# Patient Record
Sex: Male | Born: 1990 | Race: Black or African American | Hispanic: No | Marital: Single | Smoking: Current every day smoker
Health system: Southern US, Community
[De-identification: ages and names within clinical notes are randomized; demographics above are authoritative.]

## PROBLEM LIST (undated history)

## (undated) DIAGNOSIS — F909 Attention-deficit hyperactivity disorder, unspecified type: Secondary | ICD-10-CM

## (undated) DIAGNOSIS — M419 Scoliosis, unspecified: Secondary | ICD-10-CM

---

## 2010-02-17 ENCOUNTER — Emergency Department (HOSPITAL_COMMUNITY): Admission: EM | Admit: 2010-02-17 | Discharge: 2010-02-17 | Payer: Self-pay | Admitting: Emergency Medicine

## 2010-02-22 ENCOUNTER — Emergency Department (HOSPITAL_COMMUNITY): Admission: EM | Admit: 2010-02-22 | Discharge: 2010-02-22 | Payer: Self-pay | Admitting: Emergency Medicine

## 2016-05-24 ENCOUNTER — Encounter (HOSPITAL_COMMUNITY): Payer: Self-pay | Admitting: Emergency Medicine

## 2016-05-24 DIAGNOSIS — F17200 Nicotine dependence, unspecified, uncomplicated: Secondary | ICD-10-CM | POA: Insufficient documentation

## 2016-05-24 DIAGNOSIS — K529 Noninfective gastroenteritis and colitis, unspecified: Secondary | ICD-10-CM | POA: Insufficient documentation

## 2016-05-24 LAB — COMPREHENSIVE METABOLIC PANEL
ALT: 22 U/L (ref 17–63)
ANION GAP: 7 (ref 5–15)
AST: 31 U/L (ref 15–41)
Albumin: 4 g/dL (ref 3.5–5.0)
Alkaline Phosphatase: 61 U/L (ref 38–126)
BUN: 15 mg/dL (ref 6–20)
CHLORIDE: 108 mmol/L (ref 101–111)
CO2: 24 mmol/L (ref 22–32)
Calcium: 9.2 mg/dL (ref 8.9–10.3)
Creatinine, Ser: 1.05 mg/dL (ref 0.61–1.24)
Glucose, Bld: 115 mg/dL — ABNORMAL HIGH (ref 65–99)
POTASSIUM: 3.7 mmol/L (ref 3.5–5.1)
Sodium: 139 mmol/L (ref 135–145)
Total Bilirubin: 0.5 mg/dL (ref 0.3–1.2)
Total Protein: 6.7 g/dL (ref 6.5–8.1)

## 2016-05-24 LAB — CBC
HEMATOCRIT: 44.5 % (ref 39.0–52.0)
HEMOGLOBIN: 14.7 g/dL (ref 13.0–17.0)
MCH: 27.6 pg (ref 26.0–34.0)
MCHC: 33 g/dL (ref 30.0–36.0)
MCV: 83.6 fL (ref 78.0–100.0)
PLATELETS: 200 10*3/uL (ref 150–400)
RBC: 5.32 MIL/uL (ref 4.22–5.81)
RDW: 12.9 % (ref 11.5–15.5)
WBC: 7 10*3/uL (ref 4.0–10.5)

## 2016-05-24 LAB — URINALYSIS, ROUTINE W REFLEX MICROSCOPIC
Bilirubin Urine: NEGATIVE
GLUCOSE, UA: NEGATIVE mg/dL
Hgb urine dipstick: NEGATIVE
Ketones, ur: NEGATIVE mg/dL
LEUKOCYTES UA: NEGATIVE
Nitrite: NEGATIVE
PH: 5 (ref 5.0–8.0)
PROTEIN: NEGATIVE mg/dL
SPECIFIC GRAVITY, URINE: 1.021 (ref 1.005–1.030)

## 2016-05-24 NOTE — ED Triage Notes (Signed)
Patient reports multiple emesis and diarrhea onset this evening , denies fever or chills , no abdominal pain .

## 2016-05-25 ENCOUNTER — Emergency Department (HOSPITAL_COMMUNITY)
Admission: EM | Admit: 2016-05-25 | Discharge: 2016-05-25 | Disposition: A | Discharge status: Home / Self Care | Payer: Self-pay | Attending: Emergency Medicine | Admitting: Emergency Medicine

## 2016-05-25 DIAGNOSIS — R112 Nausea with vomiting, unspecified: Secondary | ICD-10-CM

## 2016-05-25 DIAGNOSIS — R197 Diarrhea, unspecified: Secondary | ICD-10-CM

## 2016-05-25 DIAGNOSIS — K529 Noninfective gastroenteritis and colitis, unspecified: Secondary | ICD-10-CM

## 2016-05-25 MED ORDER — ONDANSETRON HCL 4 MG PO TABS: 4.0000 mg | ORAL_TABLET | Freq: Four times a day (QID) | ORAL | 0 refills | Status: DC

## 2016-05-25 NOTE — ED Provider Notes (Signed)
[facility]-EMERGENCY DEPT Provider Note   CSN: [id number] Arrival date & time: 05/24/16  2154     History   Chief Complaint Chief Complaint  Patient presents with  . Emesis  . Diarrhea    HPI Trevor Taylor is a 25 y.o. male.  Patient presents with one day of nausea, vomiting and diarrhea. No fever or pain. He reports multiple emesis and loose stool throughout the day that is getting better now. He is drinking ginger ale without further vomiting. He is otherwise healthy, on no medications regularly. No sick contacts.    The history is provided by the patient. No language interpreter was used.  Emesis   Associated symptoms include diarrhea. Pertinent negatives include no abdominal pain, no chills, no fever and no myalgias.  Diarrhea   Associated symptoms include vomiting. Pertinent negatives include no abdominal pain, no chills and no myalgias.    History reviewed. No pertinent past medical history.  There are no active problems to display for this patient.   History reviewed. No pertinent surgical history.     Home Medications    Prior to Admission medications   Medication Sig Start Date End Date Taking? Authorizing Provider  Multiple Vitamin (MULTIVITAMIN WITH MINERALS) TABS tablet Take 1 tablet by mouth once a week.   Yes Historical Provider, MD    Family History No family history on file.  Social History Social History  Substance Use Topics  . Smoking status: Current Every Day Smoker  . Smokeless tobacco: Never Used  . Alcohol use No     Allergies   Patient has no known allergies.   Review of Systems Review of Systems  Constitutional: Negative for chills and fever.  Respiratory: Negative.  Negative for shortness of breath.   Cardiovascular: Negative.  Negative for chest pain.  Gastrointestinal: Positive for diarrhea, nausea and vomiting. Negative for abdominal pain.  Genitourinary: Negative for decreased urine volume.  Musculoskeletal: Negative.   Negative for myalgias.  Neurological: Negative.      Physical Exam Updated Vital Signs BP 140/91 (BP Location: Right Arm)   Pulse 63   Temp 97.9 F (36.6 C) (Oral)   Resp 16   Ht 5' 10.5" (1.791 m)   Wt 91.2 kg   SpO2 100%   BMI 28.43 kg/m   Physical Exam  Constitutional: He is oriented to person, place, and time. He appears well-developed and well-nourished.  HENT:  Mouth/Throat: Oropharynx is clear and moist.  Neck: Normal range of motion.  Pulmonary/Chest: Effort normal.  Abdominal: Soft. He exhibits no mass. There is no tenderness. There is no guarding.  Musculoskeletal: Normal range of motion.  Neurological: He is alert and oriented to person, place, and time.  Skin: Skin is warm and dry.  Psychiatric: He has a normal mood and affect.     ED Treatments / Results  Labs (all labs ordered are listed, but only abnormal results are displayed) Labs Reviewed  COMPREHENSIVE METABOLIC PANEL - Abnormal; Notable for the following:       Result Value   Glucose, Bld 115 (*)    All other components within normal limits  CBC  URINALYSIS, ROUTINE W REFLEX MICROSCOPIC   Results for orders placed or performed during the hospital encounter of 05/25/16  Comprehensive metabolic panel  Result Value Ref Range   Sodium 139 135 - 145 mmol/L   Potassium 3.7 3.5 - 5.1 mmol/L   Chloride 108 101 - 111 mmol/L   CO2 24 22 - 32 mmol/L  Glucose, Bld 115 (H) 65 - 99 mg/dL   BUN 15 6 - 20 mg/dL   Creatinine, Ser 4.091.05 0.61 - 1.24 mg/dL   Calcium 9.2 8.9 - 81.110.3 mg/dL   Total Protein 6.7 6.5 - 8.1 g/dL   Albumin 4.0 3.5 - 5.0 g/dL   AST 31 15 - 41 U/L   ALT 22 17 - 63 U/L   Alkaline Phosphatase 61 38 - 126 U/L   Total Bilirubin 0.5 0.3 - 1.2 mg/dL   GFR calc non Af Amer >60 >60 mL/min   GFR calc Af Amer >60 >60 mL/min   Anion gap 7 5 - 15  CBC  Result Value Ref Range   WBC 7.0 4.0 - 10.5 K/uL   RBC 5.32 4.22 - 5.81 MIL/uL   Hemoglobin 14.7 13.0 - 17.0 g/dL   HCT 91.444.5 78.239.0 - 95.652.0 %    MCV 83.6 78.0 - 100.0 fL   MCH 27.6 26.0 - 34.0 pg   MCHC 33.0 30.0 - 36.0 g/dL   RDW 21.312.9 08.611.5 - 57.815.5 %   Platelets 200 150 - 400 K/uL  Urinalysis, Routine w reflex microscopic  Result Value Ref Range   Color, Urine YELLOW YELLOW   APPearance CLEAR CLEAR   Specific Gravity, Urine 1.021 1.005 - 1.030   pH 5.0 5.0 - 8.0   Glucose, UA NEGATIVE NEGATIVE mg/dL   Hgb urine dipstick NEGATIVE NEGATIVE   Bilirubin Urine NEGATIVE NEGATIVE   Ketones, ur NEGATIVE NEGATIVE mg/dL   Protein, ur NEGATIVE NEGATIVE mg/dL   Nitrite NEGATIVE NEGATIVE   Leukocytes, UA NEGATIVE NEGATIVE    EKG  EKG Interpretation None       Radiology No results found.  Procedures Procedures (including critical care time)  Medications Ordered in ED Medications - No data to display   Initial Impression / Assessment and Plan / ED Course  I have reviewed the triage vital signs and the nursing notes.  Pertinent labs & imaging results that were available during my care of the patient were reviewed by me and considered in my medical decision making (see chart for details).  Clinical Course     Patient with improving N, V, D without fever or pain. Uncomplicated gastroenteritis. Will send home with rx for Zofran and provide return precautions.   Final Clinical Impressions(s) / ED Diagnoses   Final diagnoses:  None   1. Gastroenteritis  New Prescriptions New Prescriptions   No medications on file     Elpidio AnisShari Emslee Lopezmartinez, PA-C 05/25/16 0216    Arby BarretteMarcy Pfeiffer, MD 05/25/16 [phone]

## 2016-05-25 NOTE — ED Notes (Signed)
Pt verbalized understanding discharge instructions and denies any further needs or questions at this time. VS stable, ambulatory and steady gait.   

## 2017-04-14 ENCOUNTER — Emergency Department (HOSPITAL_COMMUNITY)
Admission: EM | Admit: 2017-04-14 | Discharge: 2017-04-14 | Disposition: A | Discharge status: Home / Self Care | Payer: Self-pay | Attending: Emergency Medicine | Admitting: Emergency Medicine

## 2017-04-14 DIAGNOSIS — F17200 Nicotine dependence, unspecified, uncomplicated: Secondary | ICD-10-CM | POA: Insufficient documentation

## 2017-04-14 DIAGNOSIS — Z202 Contact with and (suspected) exposure to infections with a predominantly sexual mode of transmission: Secondary | ICD-10-CM

## 2017-04-14 DIAGNOSIS — R109 Unspecified abdominal pain: Secondary | ICD-10-CM | POA: Insufficient documentation

## 2017-04-14 LAB — CBC
HEMATOCRIT: 41.8 % (ref 39.0–52.0)
HEMOGLOBIN: 14 g/dL (ref 13.0–17.0)
MCH: 27.8 pg (ref 26.0–34.0)
MCHC: 33.5 g/dL (ref 30.0–36.0)
MCV: 82.9 fL (ref 78.0–100.0)
Platelets: 196 10*3/uL (ref 150–400)
RBC: 5.04 MIL/uL (ref 4.22–5.81)
RDW: 12.9 % (ref 11.5–15.5)
WBC: 6.7 10*3/uL (ref 4.0–10.5)

## 2017-04-14 LAB — COMPREHENSIVE METABOLIC PANEL
ALT: 30 U/L (ref 17–63)
ANION GAP: 9 (ref 5–15)
AST: 31 U/L (ref 15–41)
Albumin: 4 g/dL (ref 3.5–5.0)
Alkaline Phosphatase: 66 U/L (ref 38–126)
BILIRUBIN TOTAL: 0.4 mg/dL (ref 0.3–1.2)
BUN: 15 mg/dL (ref 6–20)
CHLORIDE: 105 mmol/L (ref 101–111)
CO2: 25 mmol/L (ref 22–32)
Calcium: 8.9 mg/dL (ref 8.9–10.3)
Creatinine, Ser: 0.95 mg/dL (ref 0.61–1.24)
GFR calc Af Amer: >60 mL/min (ref >60)
Glucose, Bld: 97 mg/dL (ref 65–99)
POTASSIUM: 3.5 mmol/L (ref 3.5–5.1)
Sodium: 139 mmol/L (ref 135–145)
TOTAL PROTEIN: 7.1 g/dL (ref 6.5–8.1)

## 2017-04-14 LAB — LIPASE, BLOOD: LIPASE: 32 U/L (ref 11–51)

## 2017-04-14 MED ORDER — METRONIDAZOLE 500 MG PO TABS
2000.0000 mg | ORAL_TABLET | Freq: Once | ORAL | Status: AC
  Administered 2017-04-14: 2000 mg via ORAL
  Filled 2017-04-14: qty 4

## 2017-04-14 NOTE — ED Provider Notes (Signed)
[facility]-EMERGENCY DEPT Provider Note   CSN: [id number] Arrival date & time: 04/14/17  0021     History   Chief Complaint Chief Complaint  Patient presents with  . Abdominal Pain    HPI Trevor Taylor is a 26 y.o. male.  The history is provided by the patient and medical records.    26 year old male here for treatment of STD.  Triage note reports abdominal pain, however patient denies this.  Reports he went to the doctor with his girlfriend yesterday and she tested positive for trichomonas.  Other STD testing was negative.  He has not had any symptoms.  No history of STD in the past.  States he has an appointment with health department morning for full STD screening.  No past medical history on file.  There are no active problems to display for this patient.   No past surgical history on file.     Home Medications    Prior to Admission medications   Medication Sig Start Date End Date Taking? Authorizing Provider  Multiple Vitamin (MULTIVITAMIN WITH MINERALS) TABS tablet Take 1 tablet by mouth once a week.   Yes [provider]  ondansetron (ZOFRAN) 4 MG tablet Take 1 tablet (4 mg total) by mouth every 6 (six) hours. Patient not taking: Reported on 04/14/2017 05/25/16   Elpidio AnisUpstill, Shari, PA-C    Family History No family history on file.  Social History Social History  Substance Use Topics  . Smoking status: Current Every Day Smoker  . Smokeless tobacco: Never Used  . Alcohol use No     Allergies   Patient has no known allergies.   Review of Systems Review of Systems  Genitourinary:       STD treatment  All other systems reviewed and are negative.    Physical Exam Updated Vital Signs BP 121/76 (BP Location: Left Arm)   Pulse 78   Temp 98 F (36.7 C) (Oral)   Resp 18   SpO2 98%   Physical Exam  Constitutional: He is oriented to person, place, and time. He appears well-developed and well-nourished.  HENT:  Head:  Normocephalic and atraumatic.  Mouth/Throat: Oropharynx is clear and moist.  Eyes: Pupils are equal, round, and reactive to light. Conjunctivae and EOM are normal.  Neck: Normal range of motion.  Cardiovascular: Normal rate, regular rhythm and normal heart sounds.   Pulmonary/Chest: Effort normal and breath sounds normal.  Abdominal: Soft. Bowel sounds are normal. There is no tenderness. There is no rebound.  Musculoskeletal: Normal range of motion.  Neurological: He is alert and oriented to person, place, and time.  Skin: Skin is warm and dry.  Psychiatric: He has a normal mood and affect.  Nursing note and vitals reviewed.    ED Treatments / Results  Labs (all labs ordered are listed, but only abnormal results are displayed) Labs Reviewed  LIPASE, BLOOD  COMPREHENSIVE METABOLIC PANEL  CBC  URINALYSIS, ROUTINE W REFLEX MICROSCOPIC    EKG  EKG Interpretation None       Radiology No results found.  Procedures Procedures (including critical care time)  Medications Ordered in ED Medications  metroNIDAZOLE (FLAGYL) tablet 2,000 mg (2,000 mg Oral Given 04/14/17 0240)     Initial Impression / Assessment and Plan / ED Course  I have reviewed the triage vital signs and the nursing notes.  Pertinent labs & imaging results that were available during my care of the patient were reviewed by me and considered in my  medical decision making (see chart for details).  26 y.o. M here with treatment of trichomonas.  Triage note reports abdominal pain, however patient denies this.  No current symptoms.  Offered STD screening here, patient declined.  States he has appt with health dept tomorrow for full STD screening.  He was treated for trich here with 2g flagyl. Advised to avoid alcohol.  He understands to return here for any new or worsening symptoms.  Final Clinical Impressions(s) / ED Diagnoses   Final diagnoses:  STD exposure    New Prescriptions Discharge Medication List as  of 04/14/2017  2:50 AM       Garlon Hatchet, PA-C 04/14/17 1610    Dione Booze, MD 04/14/17 2238

## 2017-04-14 NOTE — ED Notes (Signed)
[facility]

## 2017-04-14 NOTE — Discharge Instructions (Signed)
Avoid alcohol for the next 24-48 hours as it will make you sick if you drink with the antibiotics we gave you. Follow-up with the health dept tomorrow for STD screening like you planned if you like. Return here for any new/worsening symptoms.

## 2017-04-14 NOTE — ED Notes (Signed)
Requested.

## 2017-04-26 ENCOUNTER — Emergency Department (HOSPITAL_COMMUNITY): Admission: EM | Admit: 2017-04-26 | Discharge: 2017-04-26 | Discharge status: Absent without leave | Payer: Self-pay

## 2017-04-26 NOTE — ED Notes (Signed)
Pt stated he will just go to his doctor. He dosent feel like he needs to be seen here

## 2017-07-18 ENCOUNTER — Encounter (HOSPITAL_COMMUNITY): Payer: Self-pay | Admitting: *Deleted

## 2017-07-18 ENCOUNTER — Emergency Department (HOSPITAL_COMMUNITY): Payer: Self-pay

## 2017-07-18 ENCOUNTER — Other Ambulatory Visit: Payer: Self-pay

## 2017-07-18 ENCOUNTER — Emergency Department (HOSPITAL_COMMUNITY)
Admission: EM | Admit: 2017-07-18 | Discharge: 2017-07-18 | Disposition: A | Discharge status: Home / Self Care | Payer: Self-pay | Attending: Physician Assistant | Admitting: Physician Assistant

## 2017-07-18 DIAGNOSIS — F17200 Nicotine dependence, unspecified, uncomplicated: Secondary | ICD-10-CM | POA: Insufficient documentation

## 2017-07-18 DIAGNOSIS — Z23 Encounter for immunization: Secondary | ICD-10-CM | POA: Insufficient documentation

## 2017-07-18 DIAGNOSIS — Y939 Activity, unspecified: Secondary | ICD-10-CM | POA: Insufficient documentation

## 2017-07-18 DIAGNOSIS — S01119A Laceration without foreign body of unspecified eyelid and periocular area, initial encounter: Secondary | ICD-10-CM | POA: Insufficient documentation

## 2017-07-18 DIAGNOSIS — Y999 Unspecified external cause status: Secondary | ICD-10-CM | POA: Insufficient documentation

## 2017-07-18 DIAGNOSIS — Y929 Unspecified place or not applicable: Secondary | ICD-10-CM | POA: Insufficient documentation

## 2017-07-18 DIAGNOSIS — S0181XA Laceration without foreign body of other part of head, initial encounter: Secondary | ICD-10-CM

## 2017-07-18 HISTORY — DX: Attention-deficit hyperactivity disorder, unspecified type: F90.9

## 2017-07-18 HISTORY — DX: Scoliosis, unspecified: M41.9

## 2017-07-18 MED ORDER — CEPHALEXIN 250 MG PO CAPS
500.0000 mg | ORAL_CAPSULE | Freq: Once | ORAL | Status: AC
  Administered 2017-07-18: 500 mg via ORAL
  Filled 2017-07-18: qty 2

## 2017-07-18 MED ORDER — CEPHALEXIN 500 MG PO CAPS: 500.0000 mg | ORAL_CAPSULE | Freq: Two times a day (BID) | ORAL | 0 refills | Status: DC

## 2017-07-18 MED ORDER — LIDOCAINE-EPINEPHRINE (PF) 2 %-1:200000 IJ SOLN
20.0000 mL | Freq: Once | INTRAMUSCULAR | Status: AC
  Administered 2017-07-18: 20 mL
  Filled 2017-07-18: qty 20

## 2017-07-18 MED ORDER — TETANUS-DIPHTH-ACELL PERTUSSIS 5-2.5-18.5 LF-MCG/0.5 IM SUSP
0.5000 mL | Freq: Once | INTRAMUSCULAR | Status: AC
  Administered 2017-07-18: 0.5 mL via INTRAMUSCULAR
  Filled 2017-07-18: qty 0.5

## 2017-07-18 MED ORDER — OXYCODONE-ACETAMINOPHEN 5-325 MG PO TABS
1.0000 | ORAL_TABLET | Freq: Once | ORAL | Status: AC
  Administered 2017-07-18: 1 via ORAL
  Filled 2017-07-18: qty 1

## 2017-07-18 NOTE — ED Notes (Signed)
Patient transported to CT 

## 2017-07-18 NOTE — ED Notes (Signed)
D/c reviewed with patient. No further questions

## 2017-07-18 NOTE — ED Notes (Signed)
ED Provider at bedside. 
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
## 2017-07-18 NOTE — Discharge Instructions (Signed)
Please read attached information. If you experience any new or worsening signs or symptoms please return to the emergency room for evaluation. Please follow-up with your primary care provider or specialist as discussed. Please use medication prescribed only as directed and discontinue taking if you have any concerning signs or symptoms.   °
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
## 2019-06-12 ENCOUNTER — Encounter (HOSPITAL_COMMUNITY): Payer: Self-pay

## 2019-06-12 ENCOUNTER — Ambulatory Visit (HOSPITAL_COMMUNITY)
Admission: EM | Admit: 2019-06-12 | Discharge: 2019-06-12 | Disposition: A | Discharge status: Home / Self Care | Payer: Self-pay | Attending: Family Medicine | Admitting: Family Medicine

## 2019-06-12 ENCOUNTER — Other Ambulatory Visit: Payer: Self-pay

## 2019-06-12 DIAGNOSIS — Z113 Encounter for screening for infections with a predominantly sexual mode of transmission: Secondary | ICD-10-CM | POA: Insufficient documentation

## 2019-06-12 LAB — HIV ANTIBODY (ROUTINE TESTING W REFLEX): HIV Screen 4th Generation wRfx: NONREACTIVE

## 2019-06-12 NOTE — ED Triage Notes (Signed)
Pt would like to get check for an STD, pt denies any symptoms

## 2019-06-12 NOTE — Discharge Instructions (Addendum)
We have sent testing for sexually transmitted infections. We will notify you of any positive results once they are received. If required, we will prescribe any medications you might need.  Please refrain from all sexual activity for at least the next seven days.  

## 2019-06-12 NOTE — ED Provider Notes (Signed)
[place]   [id number] 06/12/19 Arrival Time: 86  ASSESSMENT & PLAN:  1. Screening for STDs (sexually transmitted diseases)       Discharge Instructions     We have sent testing for sexually transmitted infections. We will notify you of any positive results once they are received. If required, we will prescribe any medications you might need.  Please refrain from all sexual activity for at least the next seven days.     . Pending: Labs Reviewed  HIV ANTIBODY (ROUTINE TESTING W REFLEX)  RPR  CYTOLOGY, (ORAL, ANAL, URETHRAL) ANCILLARY ONLY    Will notify of any positive results. Instructed to refrain from sexual activity for at least seven days.  Reviewed expectations re: course of current medical issues. Questions answered. Outlined signs and symptoms indicating need for more acute intervention. Patient verbalized understanding. After Visit Summary given.   SUBJECTIVE:  Trevor Taylor is a 28 y.o. male who requests screening for STDs. No symptoms including penile discharge. Denies: urinary frequency, dysuria and gross hematuria. Afebrile. No abdominal or pelvic pain. No n/v. No rashes or lesions. Reports that he is sexually active with single male partner. History of STI: none reported.  ROS: As per HPI. All other systems negative.   OBJECTIVE:  Vitals:   06/12/19 1434  BP: (!) 149/94  Pulse: 94  Resp: 16  Temp: 98.9 F (37.2 C)  TempSrc: Oral  SpO2: 100%     General appearance: alert, cooperative, appears stated age and no distress Throat: lips, mucosa, and tongue normal; teeth and gums normal CV: RRR Lungs: CTAB Back: no CVA tenderness; FROM at waist Abdomen: soft, non-tender GU: normal appearing genitalia Skin: warm and dry Psychological: alert and cooperative; normal mood and affect.    Labs Reviewed  CYTOLOGY, (ORAL, ANAL, URETHRAL) ANCILLARY ONLY    No Known Allergies  Past Medical History:  Diagnosis Date  . ADHD     . Scoliosis     Social History   Socioeconomic History  . Marital status: Single    Spouse name: Not on file  . Number of children: Not on file  . Years of education: Not on file  . Highest education level: Not on file  Occupational History  . Not on file  Tobacco Use  . Smoking status: Current Every Day Smoker  . Smokeless tobacco: Never Used  Substance and Sexual Activity  . Alcohol use: No  . Drug use: Yes    Types: Marijuana  . Sexual activity: Not on file  Other Topics Concern  . Not on file  Social History Narrative  . Not on file   Social Determinants of Health   Financial Resource Strain:   . Difficulty of Paying Living Expenses: Not on file  Food Insecurity:   . Worried About Charity fundraiser in the Last Year: Not on file  . Ran Out of Food in the Last Year: Not on file  Transportation Needs:   . Lack of Transportation (Medical): Not on file  . Lack of Transportation (Non-Medical): Not on file  Physical Activity:   . Days of Exercise per Week: Not on file  . Minutes of Exercise per Session: Not on file  Stress:   . Feeling of Stress : Not on file  Social Connections:   . Frequency of Communication with Friends and Family: Not on file  . Frequency of Social Gatherings with Friends and Family: Not on file  . Attends Religious Services: Not on file  .  Active Member of Clubs or Organizations: Not on file  . Attends Banker Meetings: Not on file  . Marital Status: Not on file  Intimate Partner Violence:   . Fear of Current or Ex-Partner: Not on file  . Emotionally Abused: Not on file  . Physically Abused: Not on file  . Sexually Abused: Not on file          Mardella Layman, MD 06/12/19 [phone]

## 2019-06-13 LAB — RPR: RPR Ser Ql: NONREACTIVE

## 2019-06-17 LAB — CYTOLOGY, (ORAL, ANAL, URETHRAL) ANCILLARY ONLY
Chlamydia: NEGATIVE
Neisseria Gonorrhea: NEGATIVE
Trichomonas: NEGATIVE

## 2020-02-20 ENCOUNTER — Encounter (HOSPITAL_COMMUNITY): Payer: Self-pay

## 2020-02-20 ENCOUNTER — Emergency Department (HOSPITAL_COMMUNITY)
Admission: EM | Admit: 2020-02-20 | Discharge: 2020-02-20 | Disposition: A | Discharge status: Home / Self Care | Payer: Self-pay | Attending: Emergency Medicine | Admitting: Emergency Medicine

## 2020-02-20 ENCOUNTER — Other Ambulatory Visit: Payer: Self-pay

## 2020-02-20 DIAGNOSIS — F17200 Nicotine dependence, unspecified, uncomplicated: Secondary | ICD-10-CM | POA: Insufficient documentation

## 2020-02-20 DIAGNOSIS — R369 Urethral discharge, unspecified: Secondary | ICD-10-CM | POA: Insufficient documentation

## 2020-02-20 DIAGNOSIS — N50819 Testicular pain, unspecified: Secondary | ICD-10-CM | POA: Insufficient documentation

## 2020-02-20 LAB — URINALYSIS, ROUTINE W REFLEX MICROSCOPIC
Bilirubin Urine: NEGATIVE
Glucose, UA: NEGATIVE mg/dL
Hgb urine dipstick: NEGATIVE
Ketones, ur: NEGATIVE mg/dL
Nitrite: NEGATIVE
Protein, ur: NEGATIVE mg/dL
Specific Gravity, Urine: 1.027 (ref 1.005–1.030)
WBC, UA: >50 WBC/hpf — ABNORMAL HIGH (ref 0–5)
pH: 5 (ref 5.0–8.0)

## 2020-02-20 LAB — HIV ANTIBODY (ROUTINE TESTING W REFLEX): HIV Screen 4th Generation wRfx: NONREACTIVE

## 2020-02-20 MED ORDER — DOXYCYCLINE HYCLATE 100 MG PO CAPS: 100.0000 mg | ORAL_CAPSULE | Freq: Two times a day (BID) | ORAL | 0 refills | Status: AC

## 2020-02-20 MED ORDER — CEFTRIAXONE SODIUM 500 MG IJ SOLR
500.0000 mg | Freq: Once | INTRAMUSCULAR | Status: AC
  Administered 2020-02-20: 500 mg via INTRAMUSCULAR
  Filled 2020-02-20: qty 500

## 2020-02-20 MED ORDER — LIDOCAINE HCL (PF) 1 % IJ SOLN
INTRAMUSCULAR | Status: AC
  Administered 2020-02-20: 5 mL
  Filled 2020-02-20: qty 5

## 2020-02-20 MED ORDER — METRONIDAZOLE 500 MG PO TABS
2000.0000 mg | ORAL_TABLET | Freq: Once | ORAL | Status: AC
  Administered 2020-02-20: 2000 mg via ORAL
  Filled 2020-02-20: qty 4

## 2020-02-20 NOTE — Discharge Instructions (Addendum)
You were given a prescription for antibiotics. Please take the antibiotic prescription fully.   You have been tested for HIV, syphilis, chlamydia and gonorrhea.  These results will be available in approximately 3 days and you will be contacted by the hospital if the results are positive. Avoid sexual contact until you are aware of the results, and please inform all sexual partners if you test positive for any of these diseases.  Please follow up with your primary care provider within 5-7 days for re-evaluation of your symptoms. If you do not have a primary care provider, information for a healthcare clinic has been provided for you to make arrangements for follow up care. Please return to the emergency department for any new or worsening symptoms.

## 2020-02-20 NOTE — ED Triage Notes (Signed)
Pt arrives to ED w/ c/o exposure STD. Pt reports symptoms of painful urination, pain in testicles, abdominal pain, yellowish discharge.

## 2020-02-20 NOTE — ED Provider Notes (Signed)
[facility] EMERGENCY DEPARTMENT Provider Note   CSN: [id number] Arrival date & time: 02/20/20  1751     History Chief Complaint  Patient presents with  . Exposure to STD    Trevor Taylor is a 29 y.o. male.  HPI   29 year old male with a history of ADHD, scoliosis, who presents the emergency department today for evaluation of penile discharge.  States for the last several days he has had penile discharge, dysuria and intermittent testicular pain.  States pain is located to both testicles and occurs intermittently however he has none currently.  He denies any swelling, redness to the scrotum/testicles.  Denies any penile lesions.  Denies any fevers or systemic symptoms.  States he had unprotected intercourse with someone several days ago and is concerned for STD.  Past Medical History:  Diagnosis Date  . ADHD   . Scoliosis     There are no problems to display for this patient.   History reviewed. No pertinent surgical history.     No family history on file.  Social History   Tobacco Use  . Smoking status: Current Every Day Smoker  . Smokeless tobacco: Never Used  Substance Use Topics  . Alcohol use: No  . Drug use: Yes    Types: Marijuana    Home Medications Prior to Admission medications   Medication Sig Start Date End Date Taking? Authorizing Provider  cephALEXin (KEFLEX) 500 MG capsule Take 1 capsule (500 mg total) by mouth 2 (two) times daily. 07/18/17   Hedges, Tinnie Gens, PA-C  doxycycline (VIBRAMYCIN) 100 MG capsule Take 1 capsule (100 mg total) by mouth 2 (two) times daily for 7 days. 02/20/20 02/27/20  Emoree Sasaki S, PA-C  Multiple Vitamin (MULTIVITAMIN WITH MINERALS) TABS tablet Take 1 tablet by mouth once a week.    [provider]  ondansetron (ZOFRAN) 4 MG tablet Take 1 tablet (4 mg total) by mouth every 6 (six) hours. Patient not taking: Reported on 04/14/2017 05/25/16   Elpidio Anis, PA-C    Allergies    Patient has no  known allergies.  Review of Systems   Review of Systems  Constitutional: Negative for fever.  HENT: Negative for sore throat.   Gastrointestinal: Negative for abdominal pain, constipation, diarrhea, nausea and vomiting.  Genitourinary: Positive for discharge, dysuria and penile pain. Negative for genital sores, hematuria, penile swelling, scrotal swelling and testicular pain.  Musculoskeletal: Negative for back pain.  Skin: Negative for rash.  Neurological: Negative for seizures and syncope.  All other systems reviewed and are negative.   Physical Exam Updated Vital Signs BP 138/79 (BP Location: Left Arm)   Pulse 64   Temp 99 F (37.2 C) (Oral)   Resp 16   SpO2 100%   Physical Exam Constitutional:      General: He is not in acute distress.    Appearance: He is well-developed.  Eyes:     Conjunctiva/sclera: Conjunctivae normal.  Cardiovascular:     Rate and Rhythm: Normal rate and regular rhythm.  Pulmonary:     Effort: Pulmonary effort is normal.     Breath sounds: Normal breath sounds.  Genitourinary:    Comments: Chaperone present. Penis uncircumcised.  No obvious penile lesions.  No swelling, redness or tenderness to the testicles or epididymis bilaterally. Skin:    General: Skin is warm and dry.  Neurological:     Mental Status: He is alert and oriented to person, place, and time.     ED Results /  Procedures / Treatments   Labs (all labs ordered are listed, but only abnormal results are displayed) Labs Reviewed  URINALYSIS, ROUTINE W REFLEX MICROSCOPIC - Abnormal; Notable for the following components:      Result Value   APPearance HAZY (*)    Leukocytes,Ua LARGE (*)    WBC, UA >50 (*)    Bacteria, UA RARE (*)    All other components within normal limits  URINE CULTURE  HIV ANTIBODY (ROUTINE TESTING W REFLEX)  RPR  GC/CHLAMYDIA PROBE AMP ([place]) NOT AT [facility]    EKG None  Radiology No results found.  Procedures Procedures (including critical  care time)  Medications Ordered in ED Medications  cefTRIAXone (ROCEPHIN) injection 500 mg (has no administration in time range)  metroNIDAZOLE (FLAGYL) tablet 2,000 mg (has no administration in time range)    ED Course  I have reviewed the triage vital signs and the nursing notes.  Pertinent labs & imaging results that were available during my care of the patient were reviewed by me and considered in my medical decision making (see chart for details).    MDM Rules/Calculators/A&P                          Patient is afebrile without abdominal tenderness, abdominal pain or painful bowel movements to indicate prostatitis.  No tenderness to palpation of the testes or epididymis to suggest orchitis or epididymitis.  STD cultures obtained including HIV, syphilis, gonorrhea and chlamydia. UA with large leuks, 0-5 RBCs, >50WBC and rare bacteria. Suspect 2/2 STD. Will send culture. Patient to be discharged with instructions to follow up with PCP. Discussed importance of using protection when sexually active. Pt understands that they have GC/Chlamydia cultures pending and that they will need to inform all sexual partners if results return positive. Patient has been treated prophylactically with doyx, flagyl, and Rocephin.    Final Clinical Impression(s) / ED Diagnoses Final diagnoses:  Penile discharge    Rx / DC Orders ED Discharge Orders         Ordered    doxycycline (VIBRAMYCIN) 100 MG capsule  2 times daily        02/20/20 2117           Karrie Meres, PA-C 02/20/20 2122    Milagros Loll, MD 02/21/20 2233

## 2020-02-21 LAB — URINE CULTURE: Culture: NO GROWTH

## 2020-02-21 LAB — RPR: RPR Ser Ql: NONREACTIVE

## 2020-03-12 ENCOUNTER — Other Ambulatory Visit: Payer: Self-pay

## 2020-03-12 ENCOUNTER — Emergency Department (HOSPITAL_COMMUNITY)
Admission: EM | Admit: 2020-03-12 | Discharge: 2020-03-12 | Disposition: A | Discharge status: Home / Self Care | Payer: Self-pay | Attending: Emergency Medicine | Admitting: Emergency Medicine

## 2020-03-12 ENCOUNTER — Encounter (HOSPITAL_COMMUNITY): Payer: Self-pay | Admitting: Emergency Medicine

## 2020-03-12 DIAGNOSIS — Z5321 Procedure and treatment not carried out due to patient leaving prior to being seen by health care provider: Secondary | ICD-10-CM | POA: Insufficient documentation

## 2020-03-12 DIAGNOSIS — Z202 Contact with and (suspected) exposure to infections with a predominantly sexual mode of transmission: Secondary | ICD-10-CM | POA: Insufficient documentation

## 2020-03-12 DIAGNOSIS — N50819 Testicular pain, unspecified: Secondary | ICD-10-CM | POA: Insufficient documentation

## 2020-03-12 DIAGNOSIS — N5089 Other specified disorders of the male genital organs: Secondary | ICD-10-CM | POA: Insufficient documentation

## 2020-03-12 NOTE — ED Triage Notes (Addendum)
Pt presents to ED POV. Pt c/o STD exposure pt reports that he was exposed to gonorrhea 2w ago. PT reports he was treated for STD when seen last but was exposed again after treatment was over. Pt having testicle pain and discharge

## 2020-03-13 ENCOUNTER — Emergency Department (HOSPITAL_COMMUNITY)
Admission: EM | Admit: 2020-03-13 | Discharge: 2020-03-14 | Disposition: A | Discharge status: Home / Self Care | Payer: Self-pay | Attending: Emergency Medicine | Admitting: Emergency Medicine

## 2020-03-13 ENCOUNTER — Other Ambulatory Visit: Payer: Self-pay

## 2020-03-13 ENCOUNTER — Encounter (HOSPITAL_COMMUNITY): Payer: Self-pay | Admitting: Emergency Medicine

## 2020-03-13 DIAGNOSIS — Z202 Contact with and (suspected) exposure to infections with a predominantly sexual mode of transmission: Secondary | ICD-10-CM | POA: Insufficient documentation

## 2020-03-13 DIAGNOSIS — F17200 Nicotine dependence, unspecified, uncomplicated: Secondary | ICD-10-CM | POA: Insufficient documentation

## 2020-03-13 MED ORDER — CEFTRIAXONE SODIUM 1 G IJ SOLR
500.0000 mg | Freq: Once | INTRAMUSCULAR | Status: AC
  Administered 2020-03-14: 500 mg via INTRAMUSCULAR
  Filled 2020-03-13: qty 10

## 2020-03-13 MED ORDER — DOXYCYCLINE HYCLATE 100 MG PO CAPS: 100.0000 mg | ORAL_CAPSULE | Freq: Two times a day (BID) | ORAL | 0 refills | Status: DC

## 2020-03-13 NOTE — Discharge Instructions (Addendum)
You were seen in the ER today due to concern for STDs.  You were tested for gonorrhea & chlamydia today. We will call you if these results are positive, we are treating you for each of these things with antibiotics, you received a shot in the ER for gonorrhea and we are covering you for chlamydia with a prescription of doxycycline to go home with.  If your tests are positive you will need to inform all sexual partners.   We have prescribed you new medication(s) today. Discuss the medications prescribed today with your pharmacist as they can have adverse effects and interactions with your other medicines including over the counter and prescribed medications. Seek medical evaluation if you start to experience new or abnormal symptoms after taking one of these medicines, seek care immediately if you start to experience difficulty breathing, feeling of your throat closing, facial swelling, or rash as these could be indications of a more serious allergic reaction  Do not participate in intercourse of any kind for at least 1 week to prevent re-infection/spread of infection.  Please follow up with the health department in 1 week.  Return to the ER for new or worsening symptoms including but not limited to fever, abdominal pain, testicular pain/swelling, pain with bowel movements, or any other concerns.

## 2020-03-13 NOTE — ED Triage Notes (Signed)
Patient reports partner positive for gonorrhea. C/o penile discharge and pain.

## 2020-03-13 NOTE — ED Provider Notes (Signed)
[facility]-EMERGENCY DEPT Provider Note   CSN: [id number] Arrival date & time: 03/13/20  2127     History Chief Complaint  Patient presents with  . Exposure to STD  . Penile Discharge    Trevor Taylor is a 29 y.o. male with a history of tobacco abuse who presents to the ED with complaints of penile discharge x 2 weeks. Patient states that discharge is yellow, non pruritic, and has associated dysuria. Otherwise no alleviating/aggravating factors. Partner tested positive for gonorrhea. Denies fever, chills, emesis, significant testicular pain/swelling, or pain with BMs.   HPI     Past Medical History:  Diagnosis Date  . ADHD   . Scoliosis     There are no problems to display for this patient.   History reviewed. No pertinent surgical history.     History reviewed. No pertinent family history.  Social History   Tobacco Use  . Smoking status: Current Every Day Smoker  . Smokeless tobacco: Never Used  Substance Use Topics  . Alcohol use: No  . Drug use: Yes    Types: Marijuana    Home Medications Prior to Admission medications   Medication Sig Start Date End Date Taking? Authorizing Provider  cephALEXin (KEFLEX) 500 MG capsule Take 1 capsule (500 mg total) by mouth 2 (two) times daily. 07/18/17   Hedges, Tinnie Gens, PA-C  Multiple Vitamin (MULTIVITAMIN WITH MINERALS) TABS tablet Take 1 tablet by mouth once a week.    [provider]  ondansetron (ZOFRAN) 4 MG tablet Take 1 tablet (4 mg total) by mouth every 6 (six) hours. Patient not taking: Reported on 04/14/2017 05/25/16   Elpidio Anis, PA-C    Allergies    Patient has no known allergies.  Review of Systems   Review of Systems  Constitutional: Negative for chills and fever.  Respiratory: Negative for shortness of breath.   Cardiovascular: Negative for chest pain.  Gastrointestinal: Negative for abdominal pain and vomiting.  Genitourinary: Positive for discharge and dysuria.  Negative for flank pain, genital sores, scrotal swelling and testicular pain.  Skin: Negative for rash.    Physical Exam Updated Vital Signs BP 131/84   Pulse 78   Temp 98.6 F (37 C) (Oral)   Resp 19   SpO2 97%   Physical Exam Vitals and nursing note reviewed. Exam conducted with a chaperone present.  Constitutional:      General: He is not in acute distress.    Appearance: He is well-developed. He is not toxic-appearing.  HENT:     Head: Normocephalic and atraumatic.  Eyes:     General:        Right eye: No discharge.        Left eye: No discharge.     Conjunctiva/sclera: Conjunctivae normal.  Cardiovascular:     Rate and Rhythm: Normal rate and regular rhythm.  Pulmonary:     Effort: Pulmonary effort is normal. No respiratory distress.     Breath sounds: Normal breath sounds. No wheezing, rhonchi or rales.  Abdominal:     General: There is no distension.     Palpations: Abdomen is soft.     Tenderness: There is no abdominal tenderness.  Genitourinary:    Penis: Circumcised. No erythema, discharge or lesions.      Testes:        Right: Mass, tenderness or swelling not present.        Left: Mass, tenderness or swelling not present.     Epididymis:  Right: Not inflamed or enlarged. No mass or tenderness.     Left: Not inflamed or enlarged. No mass or tenderness.  Musculoskeletal:     Cervical back: Neck supple.  Skin:    General: Skin is warm and dry.     Findings: No rash.  Neurological:     Mental Status: He is alert.     Comments: Clear speech.   Psychiatric:        Behavior: Behavior normal.     ED Results / Procedures / Treatments   Labs (all labs ordered are listed, but only abnormal results are displayed) Labs Reviewed  URINE CULTURE  GC/CHLAMYDIA PROBE AMP ([place]) NOT AT [facility]    EKG None  Radiology No results found.  Procedures Procedures (including critical care time)  Medications Ordered in ED Medications  cefTRIAXone  (ROCEPHIN) injection 500 mg (has no administration in time range)    ED Course  I have reviewed the triage vital signs and the nursing notes.  Pertinent labs & imaging results that were available during my care of the patient were reviewed by me and considered in my medical decision making (see chart for details).    MDM Rules/Calculators/A&P                         Patient presents to the ED with penile discharge & known gonorrhea exposure. Nontoxic appearing, vitals WNL. Benign exam. No H&P components to raise concern for epididymitis, orchitis, or prostatitis. Urine culture sent due to dysuria, however overall suspect this is secondary to STI. Cultures for GC & chlamydia obtained- patient aware of pending results and need to inform all sexual partner is positive. He declined additional STI testing (HIV/RPR). Tx w/ rocephin & doxycycline. Discussed safe sexual practices and need for protection. Health department vs. PCP follow up. I discussed results, treatment plan, need for follow-up, and return precautions with the patient. Provided opportunity for questions, patient confirmed understanding and is in agreement with plan.   Portions of this note were generated with Scientist, clinical (histocompatibility and immunogenetics). Dictation errors may occur despite best attempts at proofreading.  Final Clinical Impression(s) / ED Diagnoses Final diagnoses:  Exposure to STD    Rx / DC Orders ED Discharge Orders         Ordered    doxycycline (VIBRAMYCIN) 100 MG capsule  2 times daily        03/13/20 2349           Everton Bertha, [place] R, PA-C 03/14/20 0005    Pricilla Loveless, MD 03/14/20 0121

## 2020-03-14 MED ORDER — STERILE WATER FOR INJECTION IJ SOLN
INTRAMUSCULAR | Status: AC
  Filled 2020-03-14: qty 10

## 2020-03-15 LAB — URINE CULTURE: Culture: NO GROWTH

## 2020-03-16 LAB — GC/CHLAMYDIA PROBE AMP (~~LOC~~) NOT AT ARMC
Chlamydia: NEGATIVE
Comment: NEGATIVE
Comment: NORMAL
Neisseria Gonorrhea: POSITIVE — AB

## 2020-10-27 ENCOUNTER — Other Ambulatory Visit: Payer: Self-pay

## 2020-10-27 ENCOUNTER — Ambulatory Visit (HOSPITAL_COMMUNITY)
Admission: EM | Admit: 2020-10-27 | Discharge: 2020-10-27 | Disposition: A | Discharge status: Home / Self Care | Payer: Self-pay | Attending: Family Medicine | Admitting: Family Medicine

## 2020-10-27 ENCOUNTER — Encounter (HOSPITAL_COMMUNITY): Payer: Self-pay

## 2020-10-27 DIAGNOSIS — S91332A Puncture wound without foreign body, left foot, initial encounter: Secondary | ICD-10-CM

## 2020-10-27 DIAGNOSIS — M79672 Pain in left foot: Secondary | ICD-10-CM

## 2020-10-27 MED ORDER — TETANUS-DIPHTH-ACELL PERTUSSIS 5-2.5-18.5 LF-MCG/0.5 IM SUSY
PREFILLED_SYRINGE | INTRAMUSCULAR | Status: AC
  Filled 2020-10-27: qty 0.5

## 2020-10-27 MED ORDER — TETANUS-DIPHTH-ACELL PERTUSSIS 5-2.5-18.5 LF-MCG/0.5 IM SUSY
0.5000 mL | PREFILLED_SYRINGE | Freq: Once | INTRAMUSCULAR | Status: AC
  Administered 2020-10-27: 0.5 mL via INTRAMUSCULAR

## 2020-10-27 NOTE — ED Triage Notes (Signed)
Pt states he stepped on a safety pin today and states the pin was rusted. He is requesting a tetanus shot.

## 2020-10-27 NOTE — Discharge Instructions (Addendum)
Meds ordered this encounter  Medications   Tdap (BOOSTRIX) injection 0.5 mL    

## 2020-10-28 NOTE — ED Provider Notes (Signed)
  [facility]   [id number] 10/27/20 Arrival Time: 1900  ASSESSMENT & PLAN:  1. Foot pain, left   2. Puncture wound of left foot, initial encounter     Meds ordered this encounter  Medications  . Tdap (BOOSTRIX) injection 0.5 mL   No signs of skin infection.  Reviewed expectations re: course of current medical issues. Questions answered. Outlined signs and symptoms indicating need for more acute intervention. Patient verbalized understanding. After Visit Summary given.   SUBJECTIVE:  Trevor Taylor is a 30 y.o. male who presents with a small left plantar foot puncture wound; today; stepped on safety pen. Requests Td. No bleeding. Bearing weight without difficulty.  OBJECTIVE:  Vitals:   10/27/20 1939  BP: 113/76  Pulse: 85  Resp: 20  Temp: 99 F (37.2 C)  TempSrc: Oral  SpO2: 98%    General appearance: alert; no distress Skin: pinpoint puncture wound of L plantar foot over heel; no bleeding; non-tender Psychological: alert and cooperative; normal mood and affect  No Known Allergies  Past Medical History:  Diagnosis Date  . ADHD   . Scoliosis    Social History   Socioeconomic History  . Marital status: Single    Spouse name: Not on file  . Number of children: Not on file  . Years of education: Not on file  . Highest education level: Not on file  Occupational History  . Not on file  Tobacco Use  . Smoking status: Current Every Day Smoker  . Smokeless tobacco: Never Used  Substance and Sexual Activity  . Alcohol use: No  . Drug use: Yes    Types: Marijuana  . Sexual activity: Not on file  Other Topics Concern  . Not on file  Social History Narrative  . Not on file   Social Determinants of Health   Financial Resource Strain: Not on file  Food Insecurity: Not on file  Transportation Needs: Not on file  Physical Activity: Not on file  Stress: Not on file  Social Connections: Not on file         Mardella Layman, MD 10/28/20  [phone]

## 2022-02-17 ENCOUNTER — Ambulatory Visit
Admission: EM | Admit: 2022-02-17 | Discharge: 2022-02-17 | Disposition: A | Discharge status: Home / Self Care | Payer: Self-pay | Attending: Urgent Care | Admitting: Urgent Care

## 2022-02-17 DIAGNOSIS — R5383 Other fatigue: Secondary | ICD-10-CM | POA: Insufficient documentation

## 2022-02-17 DIAGNOSIS — R519 Headache, unspecified: Secondary | ICD-10-CM | POA: Insufficient documentation

## 2022-02-17 DIAGNOSIS — B349 Viral infection, unspecified: Secondary | ICD-10-CM | POA: Insufficient documentation

## 2022-02-17 DIAGNOSIS — Z20822 Contact with and (suspected) exposure to covid-19: Secondary | ICD-10-CM | POA: Insufficient documentation

## 2022-02-17 DIAGNOSIS — R110 Nausea: Secondary | ICD-10-CM | POA: Insufficient documentation

## 2022-02-17 DIAGNOSIS — R5381 Other malaise: Secondary | ICD-10-CM | POA: Insufficient documentation

## 2022-02-17 DIAGNOSIS — F1290 Cannabis use, unspecified, uncomplicated: Secondary | ICD-10-CM | POA: Insufficient documentation

## 2022-02-17 MED ORDER — ACETAMINOPHEN 325 MG PO TABS: 650.0000 mg | ORAL_TABLET | Freq: Four times a day (QID) | ORAL | 0 refills | Status: AC | PRN

## 2022-02-17 MED ORDER — PROMETHAZINE-DM 6.25-15 MG/5ML PO SYRP: 2.5000 mL | ORAL_SOLUTION | Freq: Three times a day (TID) | ORAL | 0 refills | Status: AC | PRN

## 2022-02-17 MED ORDER — ONDANSETRON 8 MG PO TBDP: 8.0000 mg | ORAL_TABLET | Freq: Three times a day (TID) | ORAL | 0 refills | Status: AC | PRN

## 2022-02-17 NOTE — ED Provider Notes (Signed)
[facility] - URGENT CARE CENTER  Note:  This document was prepared using Conservation officer, historic buildings and may include unintentional dictation errors.  MRN: [id number] DOB: 1991/04/15  Subjective:   Trevor Taylor is a 31 y.o. male presenting for 1 day history of acute onset intermittent mild splitting headache, nausea without vomiting, slight coughing. Tried to hydrate well. No cough, chest pain, shob, numbness or tingling, weakness, rashes. Patient is a smoker. Had multiple sick contacts in the past week with his family members.   No current facility-administered medications for this encounter.  Current Outpatient Medications:    Multiple Vitamin (MULTIVITAMIN WITH MINERALS) TABS tablet, Take 1 tablet by mouth once a week., Disp: , Rfl:    No Known Allergies  Past Medical History:  Diagnosis Date   ADHD    Scoliosis     History reviewed. No pertinent surgical history.  No family history on file.  Social History   Tobacco Use   Smoking status: Every Day   Smokeless tobacco: Never  Substance Use Topics   Alcohol use: No   Drug use: Yes    Types: Marijuana    ROS   Objective:   Vitals: BP (!) 154/94   Pulse 78   Resp 16   SpO2 95%   Physical Exam Constitutional:      General: He is not in acute distress.    Appearance: Normal appearance. He is well-developed and normal weight. He is not ill-appearing, toxic-appearing or diaphoretic.  HENT:     Head: Normocephalic and atraumatic.     Right Ear: Tympanic membrane, ear canal and external ear normal. There is no impacted cerumen.     Left Ear: Tympanic membrane, ear canal and external ear normal. There is no impacted cerumen.     Nose: Nose normal. No congestion or rhinorrhea.     Mouth/Throat:     Mouth: Mucous membranes are moist.     Pharynx: No oropharyngeal exudate or posterior oropharyngeal erythema.  Eyes:     General: No scleral icterus.       Right eye: No discharge.        Left eye: No  discharge.     Extraocular Movements: Extraocular movements intact.     Conjunctiva/sclera: Conjunctivae normal.  Neck:     Meningeal: Brudzinski's sign and Kernig's sign absent.  Cardiovascular:     Rate and Rhythm: Normal rate and regular rhythm.     Heart sounds: Normal heart sounds. No murmur heard.    No friction rub. No gallop.  Pulmonary:     Effort: Pulmonary effort is normal. No respiratory distress.     Breath sounds: Normal breath sounds. No stridor. No wheezing, rhonchi or rales.  Musculoskeletal:     Cervical back: Normal range of motion and neck supple. No rigidity. No muscular tenderness.  Neurological:     General: No focal deficit present.     Mental Status: He is alert and oriented to person, place, and time.     Cranial Nerves: No cranial nerve deficit, dysarthria or facial asymmetry.     Sensory: No sensory deficit.     Motor: No weakness.     Coordination: Romberg sign negative. Coordination normal.     Gait: Gait normal.  Psychiatric:        Mood and Affect: Mood normal.        Behavior: Behavior normal.        Thought Content: Thought content normal.     Assessment and  Plan :   PDMP not reviewed this encounter.  1. Acute viral syndrome   2. Generalized headache   3. Malaise and fatigue   4. Nausea without vomiting   5. Exposure to COVID-19 virus   6. Marijuana use    Will manage for viral illness such as viral URI, viral syndrome, viral rhinitis, COVID-19. Recommended supportive care. Offered scripts for symptomatic relief. Testing is pending. Also advised patient consider cutting back on his marijuana use as I suspect that this can contribute to his symptoms. Deferred imaging given clear cardiopulmonary exam, hemodynamically stable vital signs.  Counseled patient on potential for adverse effects with medications prescribed/recommended today, ER and return-to-clinic precautions discussed, patient verbalized understanding.    Wallis Bamberg, [place] 02/17/22  1733

## 2022-02-17 NOTE — ED Triage Notes (Signed)
Pt. States that yesterday he had some nausea, numbness in the right arm and a a splitting headache. Today he's just c/o headache and wants to make sure its not covid.

## 2022-02-17 NOTE — Discharge Instructions (Addendum)

## 2022-02-18 LAB — SARS CORONAVIRUS 2 (TAT 6-24 HRS): SARS Coronavirus 2: NEGATIVE
